# Patient Record
Sex: Female | Born: 1935 | Race: White | Hispanic: No
Health system: Southern US, Community
[De-identification: ages and names within clinical notes are randomized; demographics above are authoritative.]

---

## 2005-11-10 ENCOUNTER — Ambulatory Visit: Payer: Self-pay | Admitting: Otolaryngology

## 2006-10-03 ENCOUNTER — Emergency Department: Payer: Self-pay

## 2006-10-03 ENCOUNTER — Other Ambulatory Visit: Payer: Self-pay

## 2010-03-26 ENCOUNTER — Emergency Department: Payer: Self-pay | Admitting: Internal Medicine

## 2011-01-13 ENCOUNTER — Ambulatory Visit: Payer: Self-pay | Admitting: Family Medicine

## 2011-06-14 ENCOUNTER — Ambulatory Visit: Payer: Self-pay | Admitting: Family Medicine

## 2012-04-11 LAB — COMPREHENSIVE METABOLIC PANEL
Albumin: 2.4 g/dL — ABNORMAL LOW (ref 3.4–5.0)
Alkaline Phosphatase: 86 U/L (ref 50–136)
BUN: 27 mg/dL — ABNORMAL HIGH (ref 7–18)
Bilirubin,Total: 0.5 mg/dL (ref 0.2–1.0)
Calcium, Total: 8.7 mg/dL (ref 8.5–10.1)
Chloride: 101 mmol/L (ref 98–107)
Co2: 28 mmol/L (ref 21–32)
EGFR (African American): 47 — ABNORMAL LOW
EGFR (Non-African Amer.): 41 — ABNORMAL LOW
Glucose: 105 mg/dL — ABNORMAL HIGH (ref 65–99)
Potassium: 4.6 mmol/L (ref 3.5–5.1)
Total Protein: 7.2 g/dL (ref 6.4–8.2)

## 2012-04-11 LAB — CK TOTAL AND CKMB (NOT AT ARMC): CK, Total: 18 U/L — ABNORMAL LOW (ref 21–215)

## 2012-04-11 LAB — TROPONIN I: Troponin-I: <0.02 ng/mL

## 2012-04-11 LAB — CBC
HCT: 35.3 % (ref 35.0–47.0)
MCHC: 31.6 g/dL — ABNORMAL LOW (ref 32.0–36.0)
MCV: 84 fL (ref 80–100)
Platelet: 529 10*3/uL — ABNORMAL HIGH (ref 150–440)
WBC: 17.1 10*3/uL — ABNORMAL HIGH (ref 3.6–11.0)

## 2012-04-12 ENCOUNTER — Inpatient Hospital Stay: Payer: Self-pay | Admitting: Internal Medicine

## 2012-04-12 LAB — URINALYSIS, COMPLETE
Blood: NEGATIVE
Glucose,UR: NEGATIVE mg/dL (ref 0–75)
Hyaline Cast: 3
Protein: 30
Squamous Epithelial: <1

## 2012-04-13 LAB — CBC WITH DIFFERENTIAL/PLATELET
Eosinophil %: 2.1 %
HCT: 28 % — ABNORMAL LOW (ref 35.0–47.0)
HGB: 9 g/dL — ABNORMAL LOW (ref 12.0–16.0)
Lymphocyte #: 1.5 10*3/uL (ref 1.0–3.6)
MCHC: 32 g/dL (ref 32.0–36.0)
MCV: 85 fL (ref 80–100)
Platelet: 402 10*3/uL (ref 150–440)
RBC: 3.31 10*6/uL — ABNORMAL LOW (ref 3.80–5.20)
RDW: 17 % — ABNORMAL HIGH (ref 11.5–14.5)

## 2012-04-13 LAB — COMPREHENSIVE METABOLIC PANEL
Albumin: 1.5 g/dL — ABNORMAL LOW (ref 3.4–5.0)
Anion Gap: 7 (ref 7–16)
BUN: 17 mg/dL (ref 7–18)
Bilirubin,Total: 0.3 mg/dL (ref 0.2–1.0)
Calcium, Total: 7.4 mg/dL — ABNORMAL LOW (ref 8.5–10.1)
Co2: 23 mmol/L (ref 21–32)
Creatinine: 0.86 mg/dL (ref 0.60–1.30)
EGFR (Non-African Amer.): >60
Glucose: 79 mg/dL (ref 65–99)
Osmolality: 276 (ref 275–301)
Potassium: 4 mmol/L (ref 3.5–5.1)
Sodium: 138 mmol/L (ref 136–145)

## 2012-04-14 LAB — URINE CULTURE

## 2012-04-17 LAB — CULTURE, BLOOD (SINGLE)

## 2012-05-04 ENCOUNTER — Inpatient Hospital Stay: Payer: Self-pay | Admitting: Specialist

## 2012-05-04 LAB — COMPREHENSIVE METABOLIC PANEL
Alkaline Phosphatase: 58 U/L (ref 50–136)
BUN: 23 mg/dL — ABNORMAL HIGH (ref 7–18)
Bilirubin,Total: 0.4 mg/dL (ref 0.2–1.0)
Calcium, Total: 8.4 mg/dL — ABNORMAL LOW (ref 8.5–10.1)
Chloride: 104 mmol/L (ref 98–107)
Glucose: 99 mg/dL (ref 65–99)
SGOT(AST): 14 U/L — ABNORMAL LOW (ref 15–37)
SGPT (ALT): 12 U/L
Sodium: 134 mmol/L — ABNORMAL LOW (ref 136–145)
Total Protein: 6.9 g/dL (ref 6.4–8.2)

## 2012-05-04 LAB — CBC
MCH: 26.7 pg (ref 26.0–34.0)
MCHC: 31.5 g/dL — ABNORMAL LOW (ref 32.0–36.0)
MCV: 85 fL (ref 80–100)
Platelet: 534 10*3/uL — ABNORMAL HIGH (ref 150–440)
RDW: 18.9 % — ABNORMAL HIGH (ref 11.5–14.5)

## 2012-05-04 LAB — URINALYSIS, COMPLETE
Blood: NEGATIVE
Ketone: NEGATIVE
Ph: 7 (ref 4.5–8.0)
Protein: 30
RBC,UR: 14 /HPF (ref 0–5)
Specific Gravity: 1.014 (ref 1.003–1.030)

## 2012-05-05 LAB — BASIC METABOLIC PANEL
Anion Gap: 11 (ref 7–16)
BUN: 19 mg/dL — ABNORMAL HIGH (ref 7–18)
Calcium, Total: 8.1 mg/dL — ABNORMAL LOW (ref 8.5–10.1)
Chloride: 107 mmol/L (ref 98–107)
Creatinine: 1.1 mg/dL (ref 0.60–1.30)
EGFR (African American): 52 — ABNORMAL LOW
Osmolality: 280 (ref 275–301)

## 2012-05-05 LAB — CBC WITH DIFFERENTIAL/PLATELET
Basophil #: 0.1 10*3/uL (ref 0.0–0.1)
HCT: 30.9 % — ABNORMAL LOW (ref 35.0–47.0)
HGB: 9.7 g/dL — ABNORMAL LOW (ref 12.0–16.0)
Lymphocyte #: 1.9 10*3/uL (ref 1.0–3.6)
Lymphocyte %: 9.9 %
MCHC: 31.4 g/dL — ABNORMAL LOW (ref 32.0–36.0)
MCV: 84 fL (ref 80–100)
Monocyte %: 7.4 %
Neutrophil #: 15.8 10*3/uL — ABNORMAL HIGH (ref 1.4–6.5)
Neutrophil %: 82 %
RBC: 3.69 10*6/uL — ABNORMAL LOW (ref 3.80–5.20)
RDW: 19.1 % — ABNORMAL HIGH (ref 11.5–14.5)

## 2012-05-06 LAB — CBC WITH DIFFERENTIAL/PLATELET
Basophil #: 0 10*3/uL (ref 0.0–0.1)
Basophil %: 0.2 %
Eosinophil %: 0.6 %
HGB: 9.4 g/dL — ABNORMAL LOW (ref 12.0–16.0)
Lymphocyte #: 1.8 10*3/uL (ref 1.0–3.6)
Lymphocyte %: 14.7 %
MCH: 26.6 pg (ref 26.0–34.0)
MCHC: 31.6 g/dL — ABNORMAL LOW (ref 32.0–36.0)
Monocyte #: 1.2 x10 3/mm — ABNORMAL HIGH (ref 0.2–0.9)
Monocyte %: 9.5 %
Neutrophil #: 9.3 10*3/uL — ABNORMAL HIGH (ref 1.4–6.5)
Neutrophil %: 75 %
Platelet: 455 10*3/uL — ABNORMAL HIGH (ref 150–440)
RBC: 3.53 10*6/uL — ABNORMAL LOW (ref 3.80–5.20)
RDW: 18.8 % — ABNORMAL HIGH (ref 11.5–14.5)
WBC: 12.4 10*3/uL — ABNORMAL HIGH (ref 3.6–11.0)

## 2012-05-06 LAB — URINE CULTURE

## 2012-05-07 LAB — CBC WITH DIFFERENTIAL/PLATELET
Basophil #: 0.1 10*3/uL (ref 0.0–0.1)
Basophil %: 0.8 %
Eosinophil #: 0.1 10*3/uL (ref 0.0–0.7)
Eosinophil %: 0.8 %
HGB: 9.4 g/dL — ABNORMAL LOW (ref 12.0–16.0)
Lymphocyte #: 2.1 10*3/uL (ref 1.0–3.6)
MCHC: 31.3 g/dL — ABNORMAL LOW (ref 32.0–36.0)
MCV: 84 fL (ref 80–100)
Monocyte %: 6.9 %
Neutrophil #: 11.4 10*3/uL — ABNORMAL HIGH (ref 1.4–6.5)
Neutrophil %: 77.3 %
Platelet: 458 10*3/uL — ABNORMAL HIGH (ref 150–440)
RBC: 3.58 10*6/uL — ABNORMAL LOW (ref 3.80–5.20)
WBC: 14.8 10*3/uL — ABNORMAL HIGH (ref 3.6–11.0)

## 2012-05-08 LAB — CBC WITH DIFFERENTIAL/PLATELET
Eosinophil #: 0.2 10*3/uL (ref 0.0–0.7)
HGB: 10 g/dL — ABNORMAL LOW (ref 12.0–16.0)
Lymphocyte #: 2.2 10*3/uL (ref 1.0–3.6)
Lymphocyte %: 15.5 %
MCHC: 32 g/dL (ref 32.0–36.0)
MCV: 84 fL (ref 80–100)
Monocyte #: 1 x10 3/mm — ABNORMAL HIGH (ref 0.2–0.9)
Monocyte %: 6.8 %
Neutrophil #: 10.9 10*3/uL — ABNORMAL HIGH (ref 1.4–6.5)
Platelet: 463 10*3/uL — ABNORMAL HIGH (ref 150–440)
RBC: 3.73 10*6/uL — ABNORMAL LOW (ref 3.80–5.20)
RDW: 18.7 % — ABNORMAL HIGH (ref 11.5–14.5)
WBC: 14.4 10*3/uL — ABNORMAL HIGH (ref 3.6–11.0)

## 2012-05-10 LAB — CULTURE, BLOOD (SINGLE)

## 2012-08-31 IMAGING — CR DG CHEST 1V PORT
1 series · 1 of 1 positions shown · non-contrast
Comparison: none

REASON FOR EXAM: near syncopy
COMMENTS:

[portable]
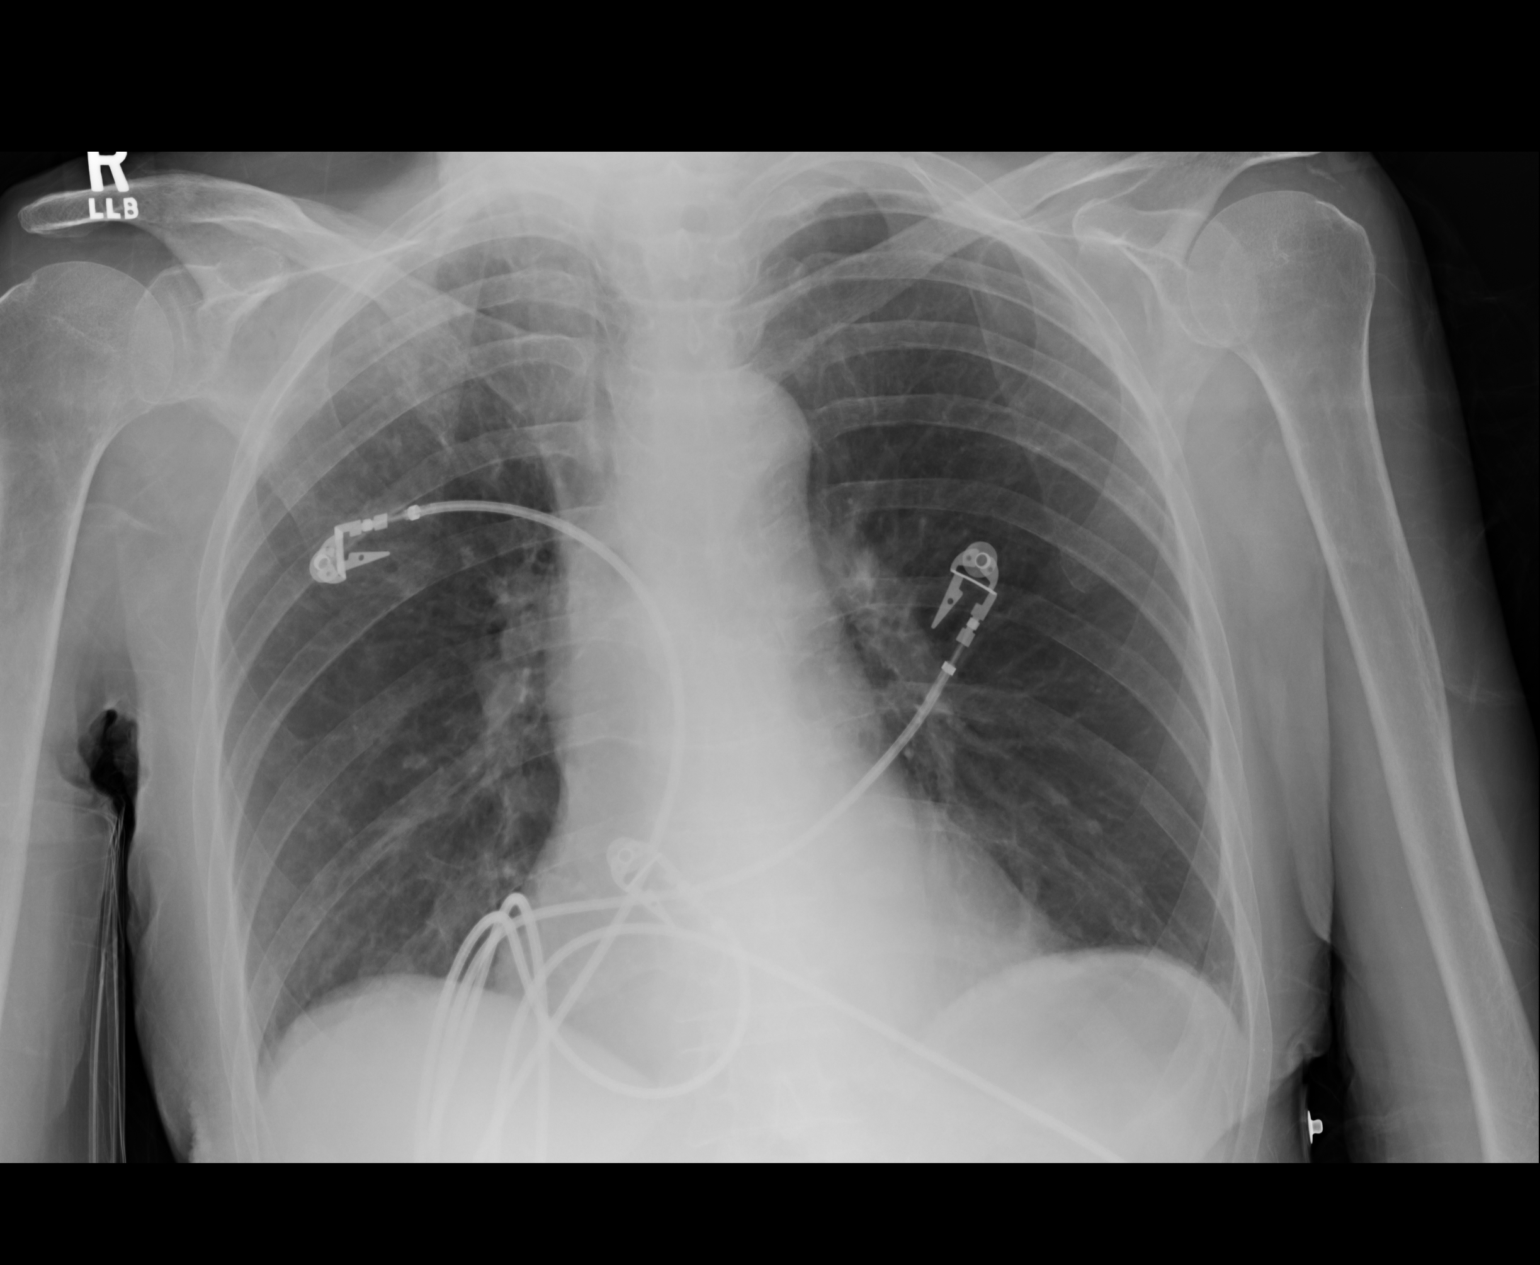

[1 of 1 positions shown; findings below may reference images not displayed]

PROCEDURE:     DXR - DXR PORTABLE CHEST SINGLE VIEW  - May 04, 2012  [DATE]

RESULT:     Comparison is made to study April 12, 2012.

The lungs are adequately inflated. There is no focal infiltrate. The cardiac
silhouette is top normal in size. The pulmonary vascularity is not engorged.
There is no pleural effusion.
IMPRESSION: I do not see evidence of acute cardiopulmonary abnormality.

[REDACTED]

## 2012-09-12 DEATH — deceased

## 2015-04-06 NOTE — H&P (Signed)
PATIENT NAME:  [name] Donovan, [name] Donovan MR#:  [id number] DATE OF BIRTH:  11-29-1922  DATE OF ADMISSION:  05/04/2012  PRIMARY CARE PHYSICIAN: Nilda Simmer, MD  CHIEF COMPLAINT: Sent in from [facility] for a fall.   HISTORY OF PRESENT ILLNESS: This is an 81 year old female who was sent in from the [facility] for a fall. Family at the bedside. Not sure if it was a fall or a trip. She usually walks with a walker. The patient has dementia and is unable to tell me what transpired. All history is obtained from the old chart and from family at bedside. In the emergency room, she was found to have an increased white blood cell count of 24.5, a positive urinalysis, and prior to changing her they did see some stool in the vaginal area. Hospitalist services were contacted for further evaluation.   PAST MEDICAL HISTORY:  1. History of renal cell carcinoma status post left nephrectomy. 2. Chronic kidney disease stage III. 3. Recurrent urinary tract infections. 4. Urinary incontinence. 5. Bronchiectasis requiring nocturnal oxygen supplementation. 6. Osteoporosis. 7. Diverticulosis.  8. Glaucoma. 9. Alzheimer's dementia.  10. Iron deficiency anemia. 11. Failure to thrive and weight loss.   PAST SURGICAL HISTORY:  1. Nephrectomy in 1999. 2. Hysterectomy. 3. Cholecystectomy.  ALLERGIES: No known drug allergies.    MEDICATIONS:  1. Vitamin D3 1,000 international units daily.  2. Aricept 5 mg daily.  3. Megace 400 mg daily.  4. Multivitamin daily.   SOCIAL HISTORY: Currently lives in the memory care unit at [facility]. No alcohol. No drug use. Used to work as a Landscape architect.  FAMILY HISTORY: Mother died at 38 of colon cancer or rectal cancer. Father died possibly in his 90s or 34s after an accident, brain hemorrhage.   REVIEW OF SYSTEMS: CONSTITUTIONAL: Positive for weight loss. Was up at 145 at one point and is now down to 110. No fever, chills, or sweats. No weakness. EYES: She does wear glasses and  has glaucoma. EARS, NOSE, MOUTH, AND THROAT: Decreased hearing. Positive for runny nose. No sore throat. No difficulty swallowing. CARDIOVASCULAR: No chest pain. RESPIRATORY: No shortness of breath. Occasional cough. No sputum. No hemoptysis. GASTROINTESTINAL: No nausea. No vomiting. No abdominal pain. No diarrhea. No constipation. No bright red blood per rectum. GENITOURINARY: No burning on urination or hematuria. MUSCULOSKELETAL: No joint pain. INTEGUMENT: No rashes or eruptions. NEUROLOGIC: No fainting or blackouts. PSYCHIATRIC: Positive for dementia. ENDOCRINE: No thyroid problems. HEMATOLOGIC/LYMPHATIC: No anemia. No easy bruising or bleeding.   PHYSICAL EXAMINATION:   VITAL SIGNS: Temperature 98.3, pulse 80, respirations 20, blood pressure 104/41, and pulse oximetry 97% on room air.   GENERAL: No respiratory distress.   EYES: Conjunctivae and lids no lesions. Pupils equal, round, and reactive to light. Unable to test extraocular muscles secondary to dementia.   EARS, NOSE, MOUTH, AND THROAT: Tympanic membranes no erythema. Nasal mucosa no erythema. Throat no erythema. No exudate seen. Lips and gums no lesions.   NECK: No JVD. No bruits. No lymphadenopathy. No thyromegaly. No thyroid nodules palpated.   RESPIRATORY: Lungs are clear to auscultation. No use of accessory muscles to breathe. No rhonchi, rales, or wheeze heard.   CARDIOVASCULAR: S1 and S2 normal. No gallops, rubs, or murmurs heard. Carotid upstroke 2+ bilaterally. No bruits.   EXTREMITIES: Dorsalis pedis pulses 2+ bilaterally. No edema to lower extremities.   ABDOMEN: Soft and nontender. No organomegaly/splenomegaly. Normoactive bowel sounds. No masses felt.   LYMPHATIC: No lymph nodes in the neck.  MUSCULOSKELETAL: No clubbing, edema, or cyanosis.   SKIN: Bruising on the left neck and skin tear on the left knee.  NEUROLOGIC: Unable to test cranial nerves secondary to dementia. The patient is able to move all extremities  on her own. Deep tendon reflexes half plus bilateral lower extremities.   PSYCHIATRIC: The patient does answer some yes and no questions, unable to elaborate very much. She does follow some simple commands but not others.  LABORATORY AND RADIOLOGICAL DATA: Urinalysis: 3+ leukocyte esterase, 1+ bacteria.   CT scan of the head: Changes of atrophy with chronic small vessel ischemic disease.   White blood cell count 24.5, hemoglobin and hematocrit 10.2 and 32.3, and platelet count 534. Glucose 99, BUN 23, creatinine 1.11, sodium 134, potassium 4.7, chloride 104, CO2 23, and calcium 8.4. Liver function tests in normal range. Troponin negative.   ASSESSMENT AND PLAN:  1. Systemic inflammatory response syndrome, UTI which is recurrent, and relative hypotension - we will give IV fluid hydration. Urine culture is sent off. Levaquin started. With the patient's frequent urinary tract infections and a recent hospitalization may benefit from a standing dose medication to prevent urinary tract infection upon discharge.  2. Syncope versus fall - we will get a physical therapy evaluation. No further work-up at this point. Most likely this is UTI related. We will not put on any telemetry monitoring. I do not want to exacerbate her dementia.  3. Dementia - continue Aricept. Does live in the memory care unit.  4. Chronic kidney disease - we will give gentle IV fluid hydration and monitor kidney function in the a.m.  5. History of bronchiectasis with nocturnal oxygen - continue oxygenation. Follow-up official report on chest x-ray.   CODE STATUS: DO NOT RESUSCITATE.   TIME SPENT ON ADMISSION: 55 minutes. ____________________________ Herschell Dimesichard J. Renae GlossWieting, MD rjw:slb D: 05/04/2012 19:57:13 ET T: 05/05/2012 06:34:20 ET JOB#: [id number]  cc: Herschell Dimesichard J. Renae GlossWieting, MD, <Dictator> Myrle ShengKristi M. Katrinka BlazingSmith, MD Salley ScarletICHARD J Cheney Ewart MD ELECTRONICALLY SIGNED 05/14/2012 14:03

## 2015-04-06 NOTE — H&P (Signed)
PATIENT NAME:  [name] Donovan, [name] Donovan MR#:  [id number] DATE OF BIRTH:  05/04/1922  DATE OF ADMISSION:  04/12/2012  PRIMARY CARE PHYSICIAN: Dr. Nilda Simmer  SUBJECTIVE: This is an 81 year old female who was brought to the ER for further evaluation because she has not been eating for the last five days. The patient stays in a memory care unit at [facility] in [place]. The patient was noted to have decreased p.o. intake along with vaginal discharge described as curdy white discharge. The patient was evaluated by Dr. Nilda Simmer, the patient's primary care provider. Initially it was thought that the patient has vaginitis and she was treated with Diflucan and metronidazole with no improvement. The patient continued to have vaginal burning, pain, itching, dryness. The patient does not have a previous history of GC or chlamydia at this time. Wet prep x2 in the outpatient setting was negative. The patient has also been noted to have leukocytosis workup at the primary care physician's office revealing a white count of 18,000. The patient has also lost about 5 pounds in the last five days. She quit eating and drinking completely three to four days ago and her urine is noted to be dark and foul smelling in the ED. The patient's son states that she has had a steady decline at least over the last two weeks but no obvious slurred speech, unilateral weakness. No obvious documented  . She has no nausea, vomiting, diarrhea or abdominal pain.   PAST MEDICAL HISTORY: 1. Iron deficiency anemia.  2. Hypersomnolence. 3. Alzheimer's dementia. 4. Glaucoma.  5. History of renal cell carcinoma status post left nephrectomy. 6. Chronic kidney disease stage III. 7. Rhinitis.  8. Recurrent urinary tract infection. 9. Urinary incontinence. 10. Bronchiectasis requiring nocturnal oxygen supplementation.  11. Osteoporosis.  12. DO NOT RESUSCITATE/DO NOT INTUBATE. 13. Diverticulosis.    PAST SURGICAL HISTORY:  1. Status post  nephrectomy in 1999.  2. Hysterectomy. 3. Cholecystectomy. 4. Bronchoscopy.   FAMILY HISTORY: She has five brothers and four sisters. Five siblings are still alive. Mother deceased from rectal cancer, died at the age of 27. Father deceased in the 2s.  SOCIAL HISTORY: Currently she lives in a memory care unit. No history of alcohol use or drug use. She is widowed since 2008. Married for 65 years.   HOME MEDICATIONS: 1. Aricept. 2. Metronidazole.  3. Vitamin D. 4. Multivitamin.  5. Naprosyn. 6. Oxygen.   REVIEW OF SYSTEMS: GENERAL: Weight loss, lack of appetite. SKIN: Without any skin rashes. HEENT: Denies any blurry vision, double vision, visual loss or eye pain. NECK: No neck pain, neck stiffness or neck swelling. RESPIRATORY: No cough, dyspnea. No hemoptysis or wheezing. CARDIOVASCULAR: No chest pain or palpitations. No difficulty breathing. GASTROINTESTINAL: No hematochezia or hematemesis. No nausea, vomiting, diarrhea or constipation. FEMALE GENITOURINARY: Vaginal discharge associated with incontinence and dysuria. NEUROLOGICAL: Decreased memory and difficult to arouse, however, no unsteadiness, no unilateral weakness, no headache, no syncopal episodes. HEMATOLOGY: No evidence of easy bleeding or bruising.   PHYSICAL EXAMINATION: VITAL SIGNS: Blood pressure 112/65, pulse 75, respirations 18.  GENERAL: Somnolent, difficult to arouse otherwise no acute cardiopulmonary distress.   HEENT: Pupils are equal and reactive. Extraocular movements are intact. No oral lesions. Oral mucosa is dry.  LUNGS: Clear to auscultation bilaterally. No wheezes or crackles. Normal respiratory effort.  CARDIOVASCULAR: Regular rate and rhythm. No murmurs, rubs or gallops.   ABDOMEN: Soft, nontender, nondistended.   EXTREMITIES: Without cyanosis, clubbing or edema.   GENITOURINARY: Foley in place without  any obvious vaginal discharge or vaginal lesions at this time.   NEUROLOGICAL: Cranial nerves II to  XII grossly nonfocal without any evidence of any gross neurologic deficits.   ASSESSMENT AND PLAN:  1. Failure to thrive with decreased oral intake and clinically dehydrated likely in the setting of urinary tract infection. Patient has been initiated on IV fluids. Nutrition consult will be obtained.  2. Urinary tract infection. The patient will be started on Rocephin. Urine culture has been sent. Blood cultures have been sent.  3. Iron deficiency anemia hemoconcentrated with a baseline hemoglobin of around 10. No obvious signs of bleeding.  4. Alzheimer's dementia. She is status post consultation by [facility] in 2008 and has been on Aricept. She has had a rapid decline over the past four months. She restarted Aricept on   which may be contributing to her anorexia. We will hold this at this time.  5. Leukocytosis. Concern about aspiration pneumonia in addition to the urinary tract infection. Speech therapy consultation will be obtained.  6. CODE STATUS: She is a DO NOT RESUSCITATE/DO NOT INTUBATE.  ____________________________ Richarda OverlieNayana Tallula Grindle, MD na:cms D: 04/12/2012 03:27:49 ET T: 04/12/2012 06:44:51 ET JOB#: [id number]  cc: Richarda OverlieNayana Srishti Strnad, MD, <Dictator> Myrle ShengKristi M. Katrinka BlazingSmith, MD Richarda OverlieNAYANA Marrie Chandra MD ELECTRONICALLY SIGNED 05/24/2012 0:12

## 2015-04-06 NOTE — Discharge Summary (Signed)
PATIENT NAME:  [name] Donovan Donovan, [name] Donovan MR#:  [id number] DATE OF BIRTH:  October 20, 1922  DATE OF ADMISSION:  05/04/2012 DATE OF DISCHARGE:  05/09/2012  HISTORY: For a detailed note, please see the history and physical done on admission by Dr. Alford Highlandichard Donovan.   DIAGNOSES AT DISCHARGE:  1. Systemic inflammatory response syndrome secondary to urinary tract infection enterococcal urinary tract infection. 2. Dementia.  3. Adult failure to thrive.   DIET: The patient is being discharged on a mechanical soft diet with Ensure supplements with meals.   ACTIVITY: As tolerated.   FOLLOW-UP: Follow-up with Dr. Nilda SimmerKristi Donovan in the next 1 to 2 weeks.   DISCHARGE MEDICATIONS: 1. Multivitamin daily.  2. Vitamin D3 1000 international units daily.  3. Megace 10 mL daily.  4. Levaquin 250 mg daily x2 days.   PERTINENT STUDIES: CT scan of the head done without contrast on admission showed chronic small cell ischemic disease with atrophy. Chest x-ray done on admission showing no  evidence of acute cardiopulmonary disease. A urine culture positive for enterococcal faecium. Blood cultures negative.   HOSPITAL COURSE: This is an 81 year old female with multiple medical problems as mentioned above who presented to the hospital from an assisted living facility at [facility] due to fall, questionable syncopal episode. The patient, when arriving to the ER, was noted to be febrile with a positive urinalysis and elevated white cell count.   1. Systemic inflammatory response syndrome. This was likely suspected due to a urinary tract infection. The patient had an abnormal urinalysis on admission. She was febrile. She was tachycardic. She was hypotensive. The patient was aggressively hydrated with IV fluids, also started on IV Levaquin. After getting some antibiotic therapy and IV fluids, her hemodynamics have significantly improved. Her white cell count has come down from 24,000 to 14,000. She has been afebrile and presently stable  and therefore is being discharged on p.o. Levaquin for treatment for her urinary tract infection with sepsis.  2. Enterococcal urinary tract infection. This was likely the cause of the systemic inflammatory response syndrome. Her urine cultures did grew out enterococcal faecium with sensitivity to Levaquin. She will finish a total of 10 day course of therapy.  3. Acute renal failure. This was likely secondary to dehydration and sepsis. The patient was hydrated with IV fluids and since then her renal function has improved and now resolved.  4. Dementia. The patient's dementia has actually gotten worse over the past few months. The patient actually comes from an assisted living facility, but currently requires a higher level of care. Therefore, is being discharged to a skilled nursing facility and possibly requiring maybe long-term care if her condition does not improve. She was on Aricept prior to coming in although as per the family, the patient has been having significant diarrhea with the Aricept. I did discontinue the Aricept and it is up to the family's discretion if they want to resume her Aricept at a later point if needed.  5. Adult failure to thrive. The patient's p.o. intake has improved after some initiation of Megace. She will continue Megace and be discharged on Ensure supplements.  6. Vaginal discharge. The patient apparently has been having a vaginal discharge on and off for the past month or so. She has had a work-up done as an outpatient with a negative wet mount. I did get a GYN consultation while she was here in the hospital. They did not recommend any further acute intervention at this time. This can be further followed  up as an outpatient if it continues to be a problem for the patient.   The patient is a DO NOT INTUBATE, DO NOT RESUSCITATE. She is being discharged back to a skilled nursing facility for further rehab.   TIME SPENT: 40 minutes. ____________________________ [name] Donovan Donovan.  [name] Donovan Kaiser, MD vjs:ap D: 05/09/2012 13:41:57 ET T: 05/09/2012 13:56:25 ET JOB#: [id number] cc: [name] Donovan Donovan. [name] Donovan Kaiser, MD, <Dictator> [name] Donovan Donovan. [name] Donovan Blazing, MD [name] Donovan Siren MD ELECTRONICALLY SIGNED 05/09/2012 15:29

## 2015-04-06 NOTE — Discharge Summary (Signed)
PATIENT NAME:  [name] Donovan, [name] Donovan MR#:  [id number] DATE OF BIRTH:  07-Mar-1922  DATE OF ADMISSION:  04/12/2012 DATE OF DISCHARGE:  04/14/2012  ADMITTING PHYSICIAN: Richarda OverlieNayana Abrol, MD  DISCHARGING PHYSICIAN: Enid Baasadhika Miana Politte, MD  PRIMARY CARE PHYSICIAN: Nilda SimmerKristi Smith, MD  CONSULTANTS: None.   DISCHARGE DIAGNOSES:  1. Failure to thrive with poor p.o. intake.  2. Asymptomatic bacteriuria, asymptomatic pyuria.  3. Alzheimer's dementia.  4. History of renal cell carcinoma, status post left nephrectomy.  5. Chronic kidney disease stage III.  6. Urinary incontinence.  7. History of frequent urinary tract infections. 8. Glaucoma.  9. Bronchiectasis requiring nocturnal oxygen supplementation.  10. Osteoporosis.  11. Chronic anemia.  DISCHARGE MEDICATIONS:  1. Donepezil 5 mg p.o. daily.  2. Vitamin D3 1000 international units daily.  3. Multivitamin 1 tablet daily.  4. Megace 400 mg/10 mL - 400 mg p.o. daily.   OXYGEN: 2 liters at nighttime.   DISCHARGE ACTIVITY: As tolerated.   DISCHARGE DIET: Regular diet.   FOLLOWUP INSTRUCTIONS: Primary care physician follow-up in 1 to 2 weeks.   LABS AND IMAGING STUDIES PRIOR TO DISCHARGE: WBC 10.4, hemoglobin 9.0, hematocrit 28.0, and platelet count 402.   Sodium 138, potassium 4.0, chloride 108, bicarbonate 23, BUN 17, creatinine 0.86, glucose 79, and calcium 7.4.   ALT 6, AST 12, alkaline phosphatase 56, total bilirubin 0.3, and albumin 1.5. Serum magnesium 1.7.  Urinalysis showed 2+ leukocyte esterase with several WBCs and trace bacteria.   Urine cultures are negative. Blood cultures have remained negative in the hospital.   Chest x-ray showed stable densities in the right upper lobe adjacent to left heart border, likely chronic COPD. No evidence of pneumonia of CHF present.   BRIEF HOSPITAL COURSE: Ms. [name] Donovan is an 81 year old elderly Caucasian female with past medical history significant for Alzheimer's dementia, anemia, and chronic  kidney disease who lives in the memory care unit at the [facility] [place] who was brought to the hospital secondary to decreased p.o. intake and vaginal discharge. The patient was treated as an outpatient with Diflucan and metronidazole. She was initially found to have an elevated white count of 17,000 in the ED and also extremely dehydrated, so she was admitted.  1. Failure to thrive with poor p.o. Part of it is secondary to her aging and also dementia. According to her son, it has been gradually getting worse over the last couple of months. She was hydrated with IV fluids in the hospital and Megace was added at the time of discharge to see if her appetite would improve.  2. Asymptomatic pyuria with some vaginal discharge and vaginal irritation. Wet prep cultures are negative.  She was treated with Rocephin while in the hospital while urine cultures were pending. She did get three days of Rocephin with improvement in her symptoms. Since her cultures are negative and white count normalized, after three days of Rocephin, antibiotics are being stopped for fear that unnecessary antibiotics would cause vaginal fungal infection. She was back to her baseline. She started eating a little bit while she was in the hospital. Overall p.o. intake has been decreased, per her son. Her code status is DO NOT RESUSCITATE. She is being discharge back to assisted living - [facility] [place].   DISCHARGE CONDITION: Guarded with poor prognosis.   DISCHARGE DISPOSITION: Assisted living.   TIME SPENT ON DISCHARGE: 40 minutes.  ____________________________ Enid Baasadhika Sameer Teeple, MD rk:slb D: 04/18/2012 14:37:07 ET T: 04/19/2012 10:44:23 ET JOB#: [id number]  cc:  Enid Baas, MD, <Dictator> Myrle Sheng. Katrinka Blazing, MD Enid Baas MD ELECTRONICALLY SIGNED 04/22/2012 10:50
